# Patient Record
Sex: Female | Born: 1950 | Race: Black or African American | Hispanic: No | Marital: Married | Smoking: Never smoker
Health system: Southern US, Community
[De-identification: ages and names within clinical notes are randomized; demographics above are authoritative.]

## PROBLEM LIST (undated history)

## (undated) DIAGNOSIS — I10 Essential (primary) hypertension: Secondary | ICD-10-CM

## (undated) DIAGNOSIS — M1990 Unspecified osteoarthritis, unspecified site: Secondary | ICD-10-CM

## (undated) DIAGNOSIS — R011 Cardiac murmur, unspecified: Secondary | ICD-10-CM

## (undated) HISTORY — DX: Cardiac murmur, unspecified: R01.1

## (undated) HISTORY — DX: Essential (primary) hypertension: I10

## (undated) HISTORY — DX: Unspecified osteoarthritis, unspecified site: M19.90

---

## 1996-07-04 HISTORY — PX: ANKLE SURGERY: SHX546

## 2014-07-22 ENCOUNTER — Ambulatory Visit: Payer: Self-pay | Admitting: Internal Medicine

## 2014-09-03 DIAGNOSIS — E559 Vitamin D deficiency, unspecified: Secondary | ICD-10-CM | POA: Insufficient documentation

## 2014-09-16 ENCOUNTER — Ambulatory Visit: Payer: Self-pay | Admitting: Internal Medicine

## 2015-12-23 ENCOUNTER — Encounter: Payer: Self-pay | Admitting: Urology

## 2015-12-23 ENCOUNTER — Ambulatory Visit (INDEPENDENT_AMBULATORY_CARE_PROVIDER_SITE_OTHER): Payer: Federal, State, Local not specified - PPO | Admitting: Urology

## 2015-12-23 VITALS — BP 145/83 | HR 60 | Ht 65.0 in | Wt 190.2 lb

## 2015-12-23 DIAGNOSIS — I10 Essential (primary) hypertension: Secondary | ICD-10-CM | POA: Insufficient documentation

## 2015-12-23 DIAGNOSIS — R3129 Other microscopic hematuria: Secondary | ICD-10-CM

## 2015-12-23 LAB — MICROSCOPIC EXAMINATION: BACTERIA UA: NONE SEEN

## 2015-12-23 LAB — URINALYSIS, COMPLETE
Bilirubin, UA: NEGATIVE
GLUCOSE, UA: NEGATIVE
Ketones, UA: NEGATIVE
Leukocytes, UA: NEGATIVE
Nitrite, UA: NEGATIVE
PROTEIN UA: NEGATIVE
Specific Gravity, UA: 1.02 (ref 1.005–1.030)
UUROB: 0.2 mg/dL (ref 0.2–1.0)
pH, UA: 5.5 (ref 5.0–7.5)

## 2015-12-23 NOTE — Progress Notes (Signed)
12/23/2015 10:09 AM   Latoya Hicks 1950-07-13 [id number]  Referring provider: Tracie Harrier, MD [address] [facility] [place], [place] [address]  Chief Complaint  Patient presents with  . New Patient (Initial Visit)    microscopic hematuria    HPI: 65 year old female referred for further evaluation of microscopic hematuria.  She's had several urinalyses over the past year which show evidence of microscopic blood. On 11/11/2015, she had 10-50 red blood cells and otherwise unremarkable urine. This was also present on 1116 with 4-10 red blood cells per high-powered field without evidence of infection.  No history of groos hematuria, UTIs or kidney stones.  No flank pain.  She does endorse occasional lower back pain.    She has no urinary complains including urinary frequency, urgency, or incontinence.  She occasionally feels that she does not empty complete and returns to the bathroom ~30 min for a very small amount.     UA today shows no evidence of endoscopic hematuria although there is 2+ blood on the dip. There is no evidence of infection and urine is otherwise unremarkable.   Never smoker.    She is fairly healthy.  She does not take any anticoagulation or ASA.     PMH: Past Medical History  Diagnosis Date  . Arthritis   . Heart murmur   . Hypertension     Surgical History: Past Surgical History  Procedure Laterality Date  . Ankle surgery Left 1998    Home Medications:    Medication List       This list is accurate as of: 12/23/15 10:09 AM.  Always use your most recent med list.               lisinopril-hydrochlorothiazide 10-12.5 MG tablet  Commonly known as:  PRINZIDE,ZESTORETIC  Take 1 tablet by mouth daily.     MULTI-VITAMINS Tabs  Take by mouth.        Allergies: No Known Allergies  Family History: Family History  Problem Relation Age of Onset  . Prostate cancer Father   . Prostate cancer Brother   . Kidney cancer  Neg Hx     Social History:  reports that she has never smoked. She does not have any smokeless tobacco history on file. She reports that she does not drink alcohol or use illicit drugs.  ROS: UROLOGY Frequent Urination?: No Hard to postpone urination?: No Burning/pain with urination?: No Get up at night to urinate?: Yes Leakage of urine?: No Urine stream starts and stops?: No Trouble starting stream?: No Do you have to strain to urinate?: No Blood in urine?: Yes Urinary tract infection?: No Sexually transmitted disease?: No Injury to kidneys or bladder?: No Painful intercourse?: No Weak stream?: No Currently pregnant?: No Vaginal bleeding?: No Last menstrual period?: n  Gastrointestinal Nausea?: No Vomiting?: No Indigestion/heartburn?: Yes Diarrhea?: No Constipation?: No  Constitutional Fever: No Night sweats?: No Weight loss?: No Fatigue?: No  Skin Skin rash/lesions?: No Itching?: No  Eyes Blurred vision?: No Double vision?: No  Ears/Nose/Throat Sore throat?: No Sinus problems?: Yes  Hematologic/Lymphatic Swollen glands?: No Easy bruising?: No  Cardiovascular Leg swelling?: No Chest pain?: Yes  Respiratory Cough?: No Shortness of breath?: No  Endocrine Excessive thirst?: No  Musculoskeletal Back pain?: Yes Joint pain?: Yes  Neurological Headaches?: Yes Dizziness?: No  Psychologic Depression?: No Anxiety?: No  Physical Exam: BP 145/83 mmHg  Pulse 60  Ht '5\' 5"'$  (1.651 m)  Wt 190 lb 3.2 oz (86.274 kg)  BMI 31.65 kg/m2  Constitutional:  Alert and oriented, No acute distress. HEENT: [employer] AT, moist mucus membranes.  Trachea midline, no masses. Cardiovascular: No clubbing, cyanosis, or edema. Respiratory: Normal respiratory effort, no increased work of breathing. GI: Abdomen is soft, nontender, nondistended, no abdominal masses GU: No CVA tenderness.  Skin: No rashes, bruises or suspicious lesions. Lymph: No cervical or inguinal  adenopathy. Neurologic: Grossly intact, no focal deficits, moving all 4 extremities. Psychiatric: Normal mood and affect.  Laboratory Data: Comprehensive Metabolic Panel (CMP) (07/12/3233 7:29 AM) Comprehensive Metabolic Panel (CMP) (57/32/2025 7:29 AM)  Component Value Ref Range  Glucose 164 (H) 70 - 110 mg/dL  Sodium 142 136 - 145 mmol/L  Potassium 3.8 3.6 - 5.1 mmol/L  Chloride 105 97 - 109 mmol/L  Carbon Dioxide (CO2) 26.2 22.0 - 32.0 mmol/L  Urea Nitrogen (BUN) 13 7 - 25 mg/dL  Creatinine 0.8 0.6 - 1.1 mg/dL  Glomerular Filtration Rate (eGFR), MDRD Estimate 88 >60 mL/min/1.73sq m   CBC w/auto Differential (5 Part) (11/11/2015 7:29 AM) CBC w/auto Differential (5 Part) (11/11/2015 7:29 AM)  Component Value Ref Range  WBC (White Blood Cell Count) 4.7 4.1 - 10.2 10^3/uL  RBC (Red Blood Cell Count) 3.86 (L) 4.04 - 5.48 10^6/uL  Hemoglobin 11.9 (L) 12.0 - 15.0 gm/dL  Hematocrit 36.3 35.0 - 47.0 %  MCV (Mean Corpuscular Volume) 94.0 80.0 - 100.0 fl  MCH (Mean Corpuscular Hemoglobin) 30.8 27.0 - 31.2 pg  MCHC (Mean Corpuscular Hemoglobin Concentration) 32.8 32.0 - 36.0 gm/dL  Platelet Count 274 150 - 450 10^3/uL      Urinalysis Urinalysis w/Microscopic (11/11/2015 7:29 AM) Urinalysis w/Microscopic (11/11/2015 7:29 AM)  Component Value Ref Range  Color Yellow Yellow, Straw  Clarity Clear Clear  Specific Gravity 1.025 1.000 - 1.030  pH, Urine 5.5 5.0 - 8.0  Protein, Urinalysis Negative Negative, Trace mg/dL  Glucose, Urinalysis Negative Negative mg/dL  Ketones, Urinalysis Negative Negative mg/dL  Blood, Urinalysis Large (A) Negative  Nitrite, Urinalysis Negative Negative  Leukocyte Esterase, Urinalysis Negative Negative  White Blood Cells, Urinalysis 0-3 None Seen, 0-3 /hpf  Red Blood Cells, Urinalysis 10-50 (A) None Seen, 0-3 /hpf  Bacteria, Urinalysis None Seen None Seen /hpf  Squamous Epithelial Cells, Urinalysis Few Rare, Few, None Seen /hpf   UA today complete  negative  Pertinent Imaging: Previous CT w/ contrast 09/2014 --> report no pathology (uanble to view images), no delayed film  Assessment & Plan:    1. Microscopic hematura We discussed the differential diagnosis for microscopic hematuria including nephrolithiasis, renal or upper tract tumors, bladder stones, UTIs, or bladder tumors as well as undetermined etiologies. Per AUA guidelines, I did recommend complete microscopic hematuria evaluation including CTU, possible urine cytology, and office cystoscopy. - Urinalysis, Complete   Return in about 4 weeks (around 01/20/2016) for f/u CT urogram, cystoscopy.  Hollice Espy, MD  [facility] [address] [place], [place] [address] [phone]

## 2016-01-29 ENCOUNTER — Other Ambulatory Visit: Payer: Federal, State, Local not specified - PPO | Admitting: Urology

## 2016-02-05 ENCOUNTER — Other Ambulatory Visit: Payer: Federal, State, Local not specified - PPO | Admitting: Urology

## 2016-02-17 ENCOUNTER — Ambulatory Visit: Payer: Medicare Other

## 2016-02-17 ENCOUNTER — Ambulatory Visit
Admission: RE | Admit: 2016-02-17 | Discharge: 2016-02-17 | Disposition: A | Discharge status: Home / Self Care | Payer: Medicare Other | Source: Ambulatory Visit | Attending: Urology | Admitting: Urology

## 2016-02-17 DIAGNOSIS — R3129 Other microscopic hematuria: Secondary | ICD-10-CM | POA: Insufficient documentation

## 2016-02-17 LAB — POCT I-STAT CREATININE: Creatinine, Ser: 0.9 mg/dL (ref 0.44–1.00)

## 2016-02-17 MED ORDER — IOPAMIDOL (ISOVUE-300) INJECTION 61%
125.0000 mL | Freq: Once | INTRAVENOUS | Status: AC | PRN
  Administered 2016-02-17: 125 mL via INTRAVENOUS

## 2016-02-24 ENCOUNTER — Ambulatory Visit (INDEPENDENT_AMBULATORY_CARE_PROVIDER_SITE_OTHER): Payer: Medicare Other | Admitting: Urology

## 2016-02-24 VITALS — BP 131/85 | HR 69 | Ht 65.0 in | Wt 185.0 lb

## 2016-02-24 DIAGNOSIS — R3129 Other microscopic hematuria: Secondary | ICD-10-CM

## 2016-02-24 DIAGNOSIS — N362 Urethral caruncle: Secondary | ICD-10-CM

## 2016-02-24 LAB — MICROSCOPIC EXAMINATION: Bacteria, UA: NONE SEEN

## 2016-02-24 LAB — URINALYSIS, COMPLETE
Bilirubin, UA: NEGATIVE
GLUCOSE, UA: NEGATIVE
KETONES UA: NEGATIVE
Leukocytes, UA: NEGATIVE
Nitrite, UA: NEGATIVE
PH UA: 5.5 (ref 5.0–7.5)
PROTEIN UA: NEGATIVE
Specific Gravity, UA: 1.025 (ref 1.005–1.030)
UUROB: 0.2 mg/dL (ref 0.2–1.0)

## 2016-02-24 MED ORDER — LIDOCAINE HCL 2 % EX GEL
1.0000 "application " | Freq: Once | CUTANEOUS | Status: AC
  Administered 2016-02-24: 1 via URETHRAL

## 2016-02-24 MED ORDER — CIPROFLOXACIN HCL 500 MG PO TABS
500.0000 mg | ORAL_TABLET | Freq: Once | ORAL | Status: AC
  Administered 2016-02-24: 500 mg via ORAL

## 2016-02-24 NOTE — Progress Notes (Addendum)
02/24/2016 4:16 PM   Gearldine BienenstockJean Woon 27-Apr-1951 [id number]  Referring provider: Barbette ReichmannVishwanath Hande, MD [address] [facility] [place], [place] [address]  Chief Complaint  Patient presents with  . Cysto    HPI: 65 year old female with microscopic hematuria who presents today for office cystoscopy.  She recently underwent CT urogram which showed no evidence of GU pathology. Incidentally, right ureter was incompletely opacified on this study.  She has no smoking history.  History of gross hematuria, flank pain, or kidney stones.   PMH: Past Medical History:  Diagnosis Date  . Arthritis   . Heart murmur   . Hypertension     Surgical History: Past Surgical History:  Procedure Laterality Date  . ANKLE SURGERY Left 1998    Home Medications:    Medication List       Accurate as of 02/24/16 11:59 PM. Always use your most recent med list.          lisinopril-hydrochlorothiazide 10-12.5 MG tablet Commonly known as:  PRINZIDE,ZESTORETIC Take 1 tablet by mouth daily.   MULTI-VITAMINS Tabs Take by mouth.       Allergies: No Known Allergies  Family History: Family History  Problem Relation Age of Onset  . Prostate cancer Father   . Prostate cancer Brother   . Kidney cancer Neg Hx     Social History:  reports that she has never smoked. She does not have any smokeless tobacco history on file. She reports that she does not drink alcohol or use drugs.   Physical Exam: BP 131/85   Pulse 69   Ht 5\' 5"  (1.651 m)   Wt 185 lb (83.9 kg)   BMI 30.79 kg/m   Constitutional:  Alert and oriented, No acute distress. HEENT: [place] AT, moist mucus membranes.  Trachea midline, no masses. Cardiovascular: No clubbing, cyanosis, or edema. Respiratory: Normal respiratory effort, no increased work of breathing. GI: Abdomen is soft, nontender, nondistended, no abdominal masses GU: Urethral meatus with a small caruncle at 6 clock position, slightly erythematous in  appearance, scant bleeding with manipulation Skin: No rashes, bruises or suspicious lesions. Neurologic: Grossly intact, no focal deficits, moving all 4 extremities. Psychiatric: Normal mood and affect.  Laboratory Data: Lab Results  Component Value Date   CREATININE 0.90 02/17/2016     Urinalysis Results for orders placed or performed in visit on 02/24/16  Microscopic Examination  Result Value Ref Range   WBC, UA 0-5 0 - 5 /hpf   RBC, UA 11-30 (A) 0 - 2 /hpf   Epithelial Cells (non renal) 0-10 0 - 10 /hpf   Bacteria, UA None seen None seen/Few  Urinalysis, Complete  Result Value Ref Range   Specific Gravity, UA 1.025 1.005 - 1.030   pH, UA 5.5 5.0 - 7.5   Color, UA Yellow Yellow   Appearance Ur Clear Clear   Leukocytes, UA Negative Negative   Protein, UA Negative Negative/Trace   Glucose, UA Negative Negative   Ketones, UA Negative Negative   RBC, UA 3+ (A) Negative   Bilirubin, UA Negative Negative   Urobilinogen, Ur 0.2 0.2 - 1.0 mg/dL   Nitrite, UA Negative Negative   Microscopic Examination See below:     Pertinent Imaging: Study Result   CLINICAL DATA:  Microscopic hematuria, intermittent low back pain, incomplete voiding.  EXAM: CT ABDOMEN AND PELVIS WITHOUT AND WITH CONTRAST  TECHNIQUE: Multidetector CT imaging of the abdomen and pelvis was performed following the standard protocol before and following the bolus  administration of intravenous contrast.  CONTRAST:  125mL ISOVUE-300 IOPAMIDOL (ISOVUE-300) INJECTION 61%  COMPARISON:  09/16/2014.  FINDINGS: Lower chest: Lung bases show no acute findings. Heart size normal. No pericardial or pleural effusion.  Hepatobiliary: Liver and gallbladder are unremarkable. No biliary ductal dilatation.  Pancreas: Negative.  Spleen: Adrenal glands are unremarkable. Sub cm low-attenuation lesion in the right kidney is too small to characterize but statistically, a cyst is most likely. Lower portion of  the right ureter is poorly opacified, limiting evaluation. Otherwise, no filling defects in the intrarenal collecting systems or ureters. No urinary stones. Bladder is unremarkable.  Adrenals/Urinary Tract: Stomach, small bowel and appendix are unremarkable. Stool is seen in the majority of the colon.  Stomach/Bowel: Scattered atherosclerotic calcification of the arterial vasculature. No pathologically enlarged lymph nodes.  Vascular/Lymphatic: Uterus and ovaries are visualized. No adnexal mass.  Reproductive: No free fluid. Mesenteries and peritoneum are unremarkable.  Other: No free fluid.  Mesenteries and peritoneum are unremarkable.  Musculoskeletal: No worrisome lytic or sclerotic lesions.  IMPRESSION: Lower portion of the right ureter is poorly opacified, limiting evaluation. Otherwise, no findings to explain the patient's hematuria.   Electronically Signed   By: Leanna BattlesMelinda  Blietz M.D.   On: 02/17/2016 12:36   CT urogram personally reviewed.  Assessment & Plan:    1. Microscopic hematuria Status post-complete workup including CT urogram which shows incidental poorly opacified right ureter which is iatrogenic along with urethra caruncle.  We discussed that there is a very small risk of missing a distal ureteral tumor on the right given the incomplete opacification given her low risk nonsmoking.  Options including proceeding to the operating room for retrograde pyelogram were discussed and she is not interested pursuing this given her very low risk.   - Urinalysis, Complete - ciprofloxacin (CIPRO) tablet 500 mg; Take 1 tablet (500 mg total) by mouth once. - lidocaine (XYLOCAINE) 2 % jelly 1 application; Place 1 application into the urethra once.  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 I explained that treatment is not needed unless she becomes symptomatic. This may be the source of her microscopic hematuria.  Return if symptoms  worsen or fail to improve.  Vanna ScotlandAshley Kijana Estock, MD  [address] [facility] [address], [address] [place], [place] [address] [phone]  Addendum: Urine cytology negative.

## 2016-03-09 ENCOUNTER — Other Ambulatory Visit: Payer: Self-pay | Admitting: Urology

## 2016-10-27 ENCOUNTER — Other Ambulatory Visit: Payer: Self-pay | Admitting: Internal Medicine

## 2016-10-27 DIAGNOSIS — Z1231 Encounter for screening mammogram for malignant neoplasm of breast: Secondary | ICD-10-CM

## 2016-11-03 ENCOUNTER — Ambulatory Visit
Admission: RE | Admit: 2016-11-03 | Discharge: 2016-11-03 | Disposition: A | Discharge status: Home / Self Care | Payer: Medicare Other | Source: Ambulatory Visit | Attending: Internal Medicine | Admitting: Internal Medicine

## 2016-11-03 DIAGNOSIS — Z1231 Encounter for screening mammogram for malignant neoplasm of breast: Secondary | ICD-10-CM | POA: Diagnosis present

## 2017-09-22 ENCOUNTER — Other Ambulatory Visit: Payer: Self-pay | Admitting: Internal Medicine

## 2017-09-22 DIAGNOSIS — M25512 Pain in left shoulder: Secondary | ICD-10-CM

## 2017-09-22 DIAGNOSIS — E559 Vitamin D deficiency, unspecified: Secondary | ICD-10-CM

## 2017-09-22 DIAGNOSIS — I10 Essential (primary) hypertension: Secondary | ICD-10-CM

## 2017-09-29 ENCOUNTER — Ambulatory Visit
Admission: RE | Admit: 2017-09-29 | Discharge: 2017-09-29 | Disposition: A | Discharge status: Home / Self Care | Payer: Medicare Other | Source: Ambulatory Visit | Attending: Internal Medicine | Admitting: Internal Medicine

## 2017-09-29 DIAGNOSIS — M7552 Bursitis of left shoulder: Secondary | ICD-10-CM | POA: Insufficient documentation

## 2017-09-29 DIAGNOSIS — M7582 Other shoulder lesions, left shoulder: Secondary | ICD-10-CM | POA: Insufficient documentation

## 2017-09-29 DIAGNOSIS — M75102 Unspecified rotator cuff tear or rupture of left shoulder, not specified as traumatic: Secondary | ICD-10-CM | POA: Insufficient documentation

## 2017-09-29 DIAGNOSIS — I10 Essential (primary) hypertension: Secondary | ICD-10-CM | POA: Insufficient documentation

## 2017-09-29 DIAGNOSIS — M25512 Pain in left shoulder: Secondary | ICD-10-CM

## 2017-09-29 DIAGNOSIS — E559 Vitamin D deficiency, unspecified: Secondary | ICD-10-CM | POA: Insufficient documentation

## 2018-08-06 ENCOUNTER — Other Ambulatory Visit: Payer: Self-pay | Admitting: Internal Medicine

## 2018-08-06 DIAGNOSIS — Z1231 Encounter for screening mammogram for malignant neoplasm of breast: Secondary | ICD-10-CM

## 2018-08-28 ENCOUNTER — Ambulatory Visit
Admission: RE | Admit: 2018-08-28 | Discharge: 2018-08-28 | Disposition: A | Discharge status: Home / Self Care | Payer: Medicare Other | Source: Ambulatory Visit | Attending: Internal Medicine | Admitting: Internal Medicine

## 2018-08-28 DIAGNOSIS — Z1231 Encounter for screening mammogram for malignant neoplasm of breast: Secondary | ICD-10-CM | POA: Insufficient documentation

## 2019-07-06 ENCOUNTER — Encounter: Payer: Self-pay | Admitting: Emergency Medicine

## 2019-07-06 ENCOUNTER — Emergency Department
Admission: EM | Admit: 2019-07-06 | Discharge: 2019-07-06 | Disposition: A | Discharge status: Home / Self Care | Payer: Medicare Other | Attending: Emergency Medicine | Admitting: Emergency Medicine

## 2019-07-06 ENCOUNTER — Emergency Department: Payer: Medicare Other

## 2019-07-06 ENCOUNTER — Other Ambulatory Visit: Payer: Self-pay

## 2019-07-06 DIAGNOSIS — R0789 Other chest pain: Secondary | ICD-10-CM | POA: Insufficient documentation

## 2019-07-06 DIAGNOSIS — K209 Esophagitis, unspecified without bleeding: Secondary | ICD-10-CM | POA: Diagnosis not present

## 2019-07-06 DIAGNOSIS — I10 Essential (primary) hypertension: Secondary | ICD-10-CM | POA: Insufficient documentation

## 2019-07-06 DIAGNOSIS — Z79899 Other long term (current) drug therapy: Secondary | ICD-10-CM | POA: Diagnosis not present

## 2019-07-06 DIAGNOSIS — R070 Pain in throat: Secondary | ICD-10-CM

## 2019-07-06 DIAGNOSIS — R079 Chest pain, unspecified: Secondary | ICD-10-CM

## 2019-07-06 LAB — CBC
HCT: 37.9 % (ref 36.0–46.0)
Hemoglobin: 12.9 g/dL (ref 12.0–15.0)
MCH: 30.9 pg (ref 26.0–34.0)
MCHC: 34 g/dL (ref 30.0–36.0)
MCV: 90.7 fL (ref 80.0–100.0)
Platelets: 263 10*3/uL (ref 150–400)
RBC: 4.18 MIL/uL (ref 3.87–5.11)
RDW: 11.7 % (ref 11.5–15.5)
WBC: 5 10*3/uL (ref 4.0–10.5)
nRBC: 0 % (ref 0.0–0.2)

## 2019-07-06 LAB — BASIC METABOLIC PANEL
Anion gap: 12 (ref 5–15)
BUN: 16 mg/dL (ref 8–23)
CO2: 27 mmol/L (ref 22–32)
Calcium: 9.6 mg/dL (ref 8.9–10.3)
Chloride: 101 mmol/L (ref 98–111)
Creatinine, Ser: 0.85 mg/dL (ref 0.44–1.00)
GFR calc Af Amer: >60 mL/min (ref >60)
GFR calc non Af Amer: >60 mL/min (ref >60)
Glucose, Bld: 133 mg/dL — ABNORMAL HIGH (ref 70–99)
Potassium: 3.9 mmol/L (ref 3.5–5.1)
Sodium: 140 mmol/L (ref 135–145)

## 2019-07-06 LAB — TROPONIN I (HIGH SENSITIVITY)
Troponin I (High Sensitivity): 4 ng/L (ref <18)
Troponin I (High Sensitivity): 5 ng/L (ref <18)

## 2019-07-06 LAB — GROUP A STREP BY PCR: Group A Strep by PCR: NOT DETECTED

## 2019-07-06 MED ORDER — ALUM & MAG HYDROXIDE-SIMETH 200-200-20 MG/5ML PO SUSP
30.0000 mL | Freq: Once | ORAL | Status: AC
  Administered 2019-07-06: 17:00:00 30 mL via ORAL
  Filled 2019-07-06: qty 30

## 2019-07-06 MED ORDER — IOHEXOL 350 MG/ML SOLN
100.0000 mL | Freq: Once | INTRAVENOUS | Status: AC | PRN
  Administered 2019-07-06: 16:00:00 100 mL via INTRAVENOUS
  Filled 2019-07-06: qty 100

## 2019-07-06 MED ORDER — PANTOPRAZOLE SODIUM 40 MG PO TBEC: 40.0000 mg | DELAYED_RELEASE_TABLET | Freq: Every day | ORAL | 1 refills | Status: AC

## 2019-07-06 MED ORDER — SODIUM CHLORIDE 0.9% FLUSH: 3.0000 mL | Freq: Once | INTRAVENOUS | Status: DC

## 2019-07-06 MED ORDER — LIDOCAINE VISCOUS HCL 2 % MT SOLN
15.0000 mL | Freq: Once | OROMUCOSAL | Status: AC
Start: 2019-07-06 — End: 2019-07-06
  Administered 2019-07-06: 17:00:00 15 mL via ORAL
  Filled 2019-07-06: qty 15

## 2019-07-06 NOTE — ED Triage Notes (Signed)
Pt to from [facility] in for left sided shoulder, neck, and chest pain that started on Thursday. Pt states that the pain has not gotten any better. She is now having pain when she swallows. Pt denies any other symptoms. Pt denies cardiac history. Pt is in NAD.

## 2019-07-06 NOTE — Discharge Instructions (Signed)
Follow-up with your regular doctor as needed.  Continue your regular medications.  Take the Protonix for the esophagitis.  Follow-up with ENT and GI.  If worsening return emergency department.

## 2019-07-06 NOTE — ED Provider Notes (Signed)
Patient was seen and examined by me with odynophagia and neck/chest pain. Clinically looks well with negative work up thus far.    Emily Filbert, MD 07/06/19 1540

## 2019-07-06 NOTE — ED Provider Notes (Signed)
[facility] Emergency Department Provider Note  ____________________________________________   First MD Initiated Contact with Patient 07/06/19 1436     (approximate)  I have reviewed the triage vital signs and the nursing notes.   HISTORY  Chief Complaint Chest Pain    HPI Latoya Hicks is a 69 y.o. female presents emergency department complaining of left-sided neck pain, sore throat, and chest pain.  States it is been difficult to swallow.  Area is hurting and burning.  She denies fever, chills, shortness of breath, or Covid exposure.    Past Medical History:  Diagnosis Date  . Arthritis   . Heart murmur   . Hypertension     Patient Active Problem List   Diagnosis Date Noted  . BP (high blood pressure) 12/23/2015  . Vitamin D deficiency 09/03/2014    Past Surgical History:  Procedure Laterality Date  . ANKLE SURGERY Left 1998    Prior to Admission medications   Medication Sig Start Date End Date Taking? Authorizing Provider  lisinopril-hydrochlorothiazide (PRINZIDE,ZESTORETIC) 10-12.5 MG tablet Take 1 tablet by mouth daily. 09/29/15   [provider]  Multiple Vitamin (MULTI-VITAMINS) TABS Take by mouth.    [provider]  pantoprazole (PROTONIX) 40 MG tablet Take 1 tablet (40 mg total) by mouth daily. 07/06/19 07/05/20  Faythe Ghee, PA-C    Allergies Patient has no known allergies.  Family History  Problem Relation Age of Onset  . Prostate cancer Father   . Prostate cancer Brother   . Kidney cancer Neg Hx   . Breast cancer Neg Hx     Social History Social History   Tobacco Use  . Smoking status: Never Smoker  . Smokeless tobacco: Never Used  Substance Use Topics  . Alcohol use: No    Alcohol/week: 0.0 standard drinks  . Drug use: No    Review of Systems  Constitutional: No fever/chills Eyes: No visual changes. ENT: Positive sore throat. Respiratory: Denies cough Cardiovascular: Positive chest  pain Gastrointestinal: Denies abdominal pain Genitourinary: Negative for dysuria. Musculoskeletal: Negative for back pain. Skin: Negative for rash. Psychiatric: Denies mood changes    ____________________________________________   PHYSICAL EXAM:  VITAL SIGNS: ED Triage Vitals  Enc Vitals Group     BP 07/06/19 1148 (!) 150/92     Pulse Rate 07/06/19 1148 72     Resp 07/06/19 1148 16     Temp 07/06/19 1148 99 F (37.2 C)     Temp Source 07/06/19 1148 Oral     SpO2 07/06/19 1148 97 %     Weight 07/06/19 1149 185 lb (83.9 kg)     Height 07/06/19 1149 5\' 5"  (1.651 m)     Head Circumference --      Peak Flow --      Pain Score 07/06/19 1149 5     Pain Loc --      Pain Edu? --      Excl. in GC? --     Constitutional: Alert and oriented. Well appearing and in no acute distress. Eyes: Conjunctivae are normal.  Head: Atraumatic. Nose: No congestion/rhinnorhea. Mouth/Throat: Mucous membranes are moist.  Throat appears to be irritated Neck:  supple no lymphadenopathy noted, left side of the neck is tender to palpation Cardiovascular: Normal rate, regular rhythm. Heart sounds are normal Respiratory: Normal respiratory effort.  No retractions, lungs c t a  Abd: soft nontender bs normal all 4 quad GU: deferred Musculoskeletal: FROM all extremities, warm and well perfused Neurologic:  Normal speech and language.  Skin:  Skin is warm, dry and intact. No rash noted. Psychiatric: Mood and affect are normal. Speech and behavior are normal.  ____________________________________________   LABS (all labs ordered are listed, but only abnormal results are displayed)  Labs Reviewed  BASIC METABOLIC PANEL - Abnormal; Notable for the following components:      Result Value   Glucose, Bld 133 (*)    All other components within normal limits  GROUP A STREP BY PCR  CBC  TROPONIN I (HIGH SENSITIVITY)  TROPONIN I (HIGH SENSITIVITY)   ____________________________________________    ____________________________________________  RADIOLOGY  Chest x-ray is normal CT angios chest does not show dissection  ____________________________________________   PROCEDURES  Procedure(s) performed: GI cocktail   Procedures    ____________________________________________   INITIAL IMPRESSION / ASSESSMENT AND PLAN / ED COURSE  Pertinent labs & imaging results that were available during my care of the patient were reviewed by me and considered in my medical decision making (see chart for details).   Patient is a 69 year old female presents emergency department complaining of a sore throat and left-sided chest pain which started last night.  See HPI  Physical exam patient appears well.  Left side of the neck is tender to palpation, throat appears irritated, remainder the exam is unremarkable  DDx: Strep throat, esophagitis, lymphadenitis, aortic dissection  CBC is normal, troponin is normal, basic metabolic panel is normal, strep test is negative  Chest x-ray is normal CT angio of the chest abdomen pelvis did not show dissection  The patient was given a GI cocktail which included viscous lidocaine, patient states she had some relief with this medication.  Therefore I feel it would be safe to say patient is having some esophagitis type issues.  She is to follow-up with ENT and GI.  She was given a prescription for Protonix.  She is to follow-up with her regular doctor as needed.  Return emergency department worsening.  She states she understands will comply.  She was discharged in stable condition.   Latoya Hicks was evaluated in Emergency Department on 07/06/2019 for the symptoms described in the history of present illness. She was evaluated in the context of the global COVID-19 pandemic, which necessitated consideration that the patient might be at risk for infection with the SARS-CoV-2 virus that causes COVID-19. Institutional protocols and algorithms that pertain to the  evaluation of patients at risk for COVID-19 are in a state of rapid change based on information released by regulatory bodies including the CDC and federal and state organizations. These policies and algorithms were followed during the patient's care in the ED.   As part of my medical decision making, I reviewed the following data within the electronic MEDICAL RECORD NUMBER Nursing notes reviewed and incorporated, Labs reviewed see above, EKG interpreted NSR, Old chart reviewed, Radiograph reviewed chest x-ray normal, CT angio chest abdomen pelvis is negative, Evaluated by EM attending Dr. Mayford Knife, Notes from prior ED visits and [place] Controlled Substance Database  ____________________________________________   FINAL CLINICAL IMPRESSION(S) / ED DIAGNOSES  Final diagnoses:  Throat pain in adult  Nonspecific chest pain  Esophagitis      NEW MEDICATIONS STARTED DURING THIS VISIT:  New Prescriptions   PANTOPRAZOLE (PROTONIX) 40 MG TABLET    Take 1 tablet (40 mg total) by mouth daily.     Note:  This document was prepared using Dragon voice recognition software and may include unintentional dictation errors.    Faythe Ghee,  PA-C 07/06/19 Cleophas Dunker, MD 07/07/19 [phone]

## 2019-10-03 ENCOUNTER — Other Ambulatory Visit: Payer: Self-pay | Admitting: Internal Medicine

## 2019-10-03 DIAGNOSIS — Z1231 Encounter for screening mammogram for malignant neoplasm of breast: Secondary | ICD-10-CM

## 2019-10-22 ENCOUNTER — Ambulatory Visit (INDEPENDENT_AMBULATORY_CARE_PROVIDER_SITE_OTHER): Payer: Medicare Other

## 2019-10-22 ENCOUNTER — Ambulatory Visit
Admission: RE | Admit: 2019-10-22 | Discharge: 2019-10-22 | Disposition: A | Discharge status: Home / Self Care | Payer: Medicare Other | Source: Ambulatory Visit | Attending: Internal Medicine | Admitting: Internal Medicine

## 2019-10-22 ENCOUNTER — Other Ambulatory Visit: Payer: Self-pay

## 2019-10-22 ENCOUNTER — Other Ambulatory Visit: Payer: Self-pay | Admitting: Podiatry

## 2019-10-22 ENCOUNTER — Encounter: Payer: Self-pay | Admitting: Podiatry

## 2019-10-22 ENCOUNTER — Ambulatory Visit (INDEPENDENT_AMBULATORY_CARE_PROVIDER_SITE_OTHER): Payer: Medicare Other | Admitting: Podiatry

## 2019-10-22 DIAGNOSIS — M79671 Pain in right foot: Secondary | ICD-10-CM

## 2019-10-22 DIAGNOSIS — M722 Plantar fascial fibromatosis: Secondary | ICD-10-CM | POA: Diagnosis not present

## 2019-10-22 DIAGNOSIS — M79672 Pain in left foot: Secondary | ICD-10-CM

## 2019-10-22 DIAGNOSIS — Z1231 Encounter for screening mammogram for malignant neoplasm of breast: Secondary | ICD-10-CM | POA: Insufficient documentation

## 2019-10-22 DIAGNOSIS — M7662 Achilles tendinitis, left leg: Secondary | ICD-10-CM

## 2019-10-22 NOTE — Progress Notes (Signed)
Subjective:  Patient ID: Latoya Hicks, female    DOB: [birth year],  MRN: [id number]  Chief Complaint  Patient presents with  . Foot Pain    pt is here for bottom/ back of heel pain, both feet, going on for about a couple of months, pt states it is painful to walk on, pt also has a possible bunion of the right foot she wants looked at as well.    69 y.o. female presents with the above complaint.  Patient presents with right heel pain and left posterior heel pain.  Patient states that they have been going on on and off for the past couple of months.  She states is painful to walk on pain and worse especially on the left side.  She states that she has not tried anything at home.  She has not seen anyone else for this.  She wears new balance supportive shoes.  She denies any other acute complaints.  Her pain is dull achy with sometimes sharp shooting.  Pain scale 7 out of 10.   Review of Systems: Negative except as noted in the HPI. Denies N/V/F/Ch.  Past Medical History:  Diagnosis Date  . Arthritis   . Heart murmur   . Hypertension     Current Outpatient Medications:  .  alendronate (FOSAMAX) 70 MG tablet, Take 70 mg by mouth once a week., Disp: , Rfl:  .  lisinopril-hydrochlorothiazide (PRINZIDE,ZESTORETIC) 10-12.5 MG tablet, Take 1 tablet by mouth daily., Disp: , Rfl: 1 .  Multiple Vitamin (MULTI-VITAMINS) TABS, Take by mouth., Disp: , Rfl:  .  pantoprazole (PROTONIX) 40 MG tablet, Take 1 tablet (40 mg total) by mouth daily., Disp: 30 tablet, Rfl: 1  Social History   Tobacco Use  Smoking Status Never Smoker  Smokeless Tobacco Never Used    No Known Allergies Objective:  There were no vitals filed for this visit. There is no height or weight on file to calculate BMI. Constitutional Well developed. Well nourished.  Vascular Dorsalis pedis pulses palpable bilaterally. Posterior tibial pulses palpable bilaterally. Capillary refill normal to all digits.  No cyanosis or clubbing  noted. Pedal hair growth normal.  Neurologic Normal speech. Oriented to person, place, and time. Epicritic sensation to light touch grossly present bilaterally.  Dermatologic Nails well groomed and normal in appearance. No open wounds. No skin lesions.  Orthopedic: Normal joint ROM without pain or crepitus bilaterally. No visible deformities. Tender to palpation at the calcaneal tuber right. No pain with calcaneal squeeze right. Ankle ROM diminished range of motion bilaterally. Silfverskiold Test: positive bilaterally.  Pain on the palpation of the Achilles tendon insertion.  No pain along the course of the tendon.  Pain with dorsiflexion of the ankle joint pain improved with plantarflexion of the ankle joint.  These findings were consistent with both active and passive range of motion.  No pain at the peroneal tendon, ATFL, posterior tibial tendon   Radiographs: Taken and reviewed. No acute fractures or dislocations. No evidence of stress fracture.  Plantar heel spur present. Posterior heel spur present.   Assessment:   1. Foot pain, bilateral    Plan:  Patient was evaluated and treated and all questions answered.  Plantar Fasciitis, Right  - XR reviewed as above.  - Educated on icing and stretching. Instructions given.  - Injection delivered to the plantar fascia as below. - DME: Plantar Fascial Brace - Pharmacologic management: None. Educated on risks/benefits and proper taking of medication.  Left Achilles tendinitis -I explained the  patient the etiology of Achilles tendinitis and various treatment options were extensively discussed.  Given that patient has mild to moderate pain to the left Achilles tendon insertion. -Tri-Lock ankle brace were dispensed to give stability to left ankle and therefore allow reduction in pain.  If there is no improvement will consider placing her in a cam boot immobilization to the left side.  Procedure: Injection Tendon/Ligament Location: Right  plantar fascia at the glabrous junction; medial approach. Skin Prep: alcohol Injectate: 0.5 cc 0.5% marcaine plain, 0.5 cc of 1% Lidocaine, 0.5 cc kenalog 10. Disposition: Patient tolerated procedure well. Injection site dressed with a band-aid.  No follow-ups on file.

## 2019-11-28 ENCOUNTER — Other Ambulatory Visit: Payer: Self-pay

## 2019-11-28 ENCOUNTER — Ambulatory Visit (INDEPENDENT_AMBULATORY_CARE_PROVIDER_SITE_OTHER): Payer: Medicare Other | Admitting: Podiatry

## 2019-11-28 DIAGNOSIS — M7730 Calcaneal spur, unspecified foot: Secondary | ICD-10-CM | POA: Diagnosis not present

## 2019-11-28 DIAGNOSIS — M7662 Achilles tendinitis, left leg: Secondary | ICD-10-CM

## 2019-11-28 DIAGNOSIS — M79671 Pain in right foot: Secondary | ICD-10-CM

## 2019-11-28 DIAGNOSIS — M79672 Pain in left foot: Secondary | ICD-10-CM | POA: Diagnosis not present

## 2019-11-28 DIAGNOSIS — M722 Plantar fascial fibromatosis: Secondary | ICD-10-CM | POA: Diagnosis not present

## 2019-12-04 ENCOUNTER — Encounter: Payer: Self-pay | Admitting: Podiatry

## 2019-12-04 NOTE — Progress Notes (Signed)
Subjective:  Patient ID: Latoya Hicks, female    DOB: 27-Dec-1950,  MRN: [id number]  Chief Complaint  Patient presents with  . Foot Pain    pt is here for a f/u of foot pain bil, pt states that both of her feet are still hurting, pt states that the injection she recieved last time has worn off a little bit, pt also puts pain scale right foot a 6 out of 10, and the left foot pain scale as a 2 out of 72    69 y.o. female presents with the above complaint.  Patient is following up with bilateral plantar fasciitis follow-up.  Patient states he is doing well on the left side he still has some pain on the right side.  He states the injection did help a lot.  He has been wearing his plantar fascial braces which has helped tremendously.  He has been doing his exercises well.  His pain scale 6 out of 10 on the right and 2 out of 10 on the left.  He denies any other acute complaints.  He would like to discuss further treatment options if there is anything else that could be done.   Review of Systems: Negative except as noted in the HPI. Denies N/V/F/Ch.  Past Medical History:  Diagnosis Date  . Arthritis   . Heart murmur   . Hypertension     Current Outpatient Medications:  .  alendronate (FOSAMAX) 70 MG tablet, Take 70 mg by mouth once a week., Disp: , Rfl:  .  lisinopril-hydrochlorothiazide (PRINZIDE,ZESTORETIC) 10-12.5 MG tablet, Take 1 tablet by mouth daily., Disp: , Rfl: 1 .  Multiple Vitamin (MULTI-VITAMINS) TABS, Take by mouth., Disp: , Rfl:  .  pantoprazole (PROTONIX) 40 MG tablet, Take 1 tablet (40 mg total) by mouth daily., Disp: 30 tablet, Rfl: 1  Social History   Tobacco Use  Smoking Status Never Smoker  Smokeless Tobacco Never Used    No Known Allergies Objective:  There were no vitals filed for this visit. There is no height or weight on file to calculate BMI. Constitutional Well developed. Well nourished.  Vascular Dorsalis pedis pulses palpable bilaterally. Posterior  tibial pulses palpable bilaterally. Capillary refill normal to all digits.  No cyanosis or clubbing noted. Pedal hair growth normal.  Neurologic Normal speech. Oriented to person, place, and time. Epicritic sensation to light touch grossly present bilaterally.  Dermatologic Nails well groomed and normal in appearance. No open wounds. No skin lesions.  Orthopedic: Normal joint ROM without pain or crepitus bilaterally. No visible deformities. Tender to palpation at the calcaneal tuber r bilateral No pain with calcaneal squeeze right. Ankle ROM diminished range of motion bilaterally. Silfverskiold Test: positive bilaterally.  No pain on the palpation of the Achilles tendon insertion.  No pain along the course of the tendon.  No pain with dorsiflexion of the ankle joint pain improved with plantarflexion of the ankle joint.  These findings were consistent with both active and passive range of motion.  No pain at the peroneal tendon, ATFL, posterior tibial tendon   Radiographs: Taken and reviewed. No acute fractures or dislocations. No evidence of stress fracture.  Plantar heel spur present. Posterior heel spur present.   Assessment:   1. Plantar fasciitis of right foot   2. Left Achilles tendinitis   3. Plantar fasciitis of left foot   4. Foot pain, bilateral   5. Heel spur, unspecified laterality    Plan:  Patient was evaluated and treated and all  questions answered.  Plantar Fasciitis, bilateral - XR reviewed as above.  - Educated on icing and stretching. Instructions given.  -Second injection delivered to the plantar fascia as below. - DME: Plantar Fascial Brace - Pharmacologic management: None. Educated on risks/benefits and proper taking of medication.  Left Achilles tendinitis -I resolved with a Tri-Lock ankle brace.  I discussed with the patient to continue stretching etc. continue wearing Tri-Lock ankle brace.  Procedure: Injection Tendon/Ligament Location: Bilateral  plantar fascia at the glabrous junction; medial approach. Skin Prep: alcohol Injectate: 0.5 cc 0.5% marcaine plain, 0.5 cc of 1% Lidocaine, 0.5 cc kenalog 10. Disposition: Patient tolerated procedure well. Injection site dressed with a band-aid.  No follow-ups on file.

## 2019-12-26 ENCOUNTER — Ambulatory Visit: Payer: Medicare Other | Admitting: Podiatry

## 2019-12-30 ENCOUNTER — Other Ambulatory Visit: Payer: Self-pay

## 2019-12-30 ENCOUNTER — Encounter: Payer: Self-pay | Admitting: Podiatry

## 2019-12-30 ENCOUNTER — Ambulatory Visit (INDEPENDENT_AMBULATORY_CARE_PROVIDER_SITE_OTHER): Payer: Medicare Other | Admitting: Podiatry

## 2019-12-30 DIAGNOSIS — M722 Plantar fascial fibromatosis: Secondary | ICD-10-CM

## 2019-12-30 NOTE — Progress Notes (Signed)
Subjective:  Patient ID: Latoya Hicks, female    DOB: 09-Sep-1950,  MRN: [id number]  Chief Complaint  Patient presents with  . Foot Pain    "It's doing better but it still has some pain in it."    69 y.o. female presents with the above complaint.  Patient presents with follow-up of bilateral plantar fasciitis.  Patient states she is doing well on both sides she still has a little bit of pain associated with it.  She states that the pain has gotten much better.  She still has not gone 100% better.  She would like to know if there is any other treatment options available.  She does not want steroid injection if she can avoid it.  She denies any other acute complaints.   Review of Systems: Negative except as noted in the HPI. Denies N/V/F/Ch.  Past Medical History:  Diagnosis Date  . Arthritis   . Heart murmur   . Hypertension     Current Outpatient Medications:  .  alendronate (FOSAMAX) 70 MG tablet, Take 70 mg by mouth once a week., Disp: , Rfl:  .  lisinopril-hydrochlorothiazide (PRINZIDE,ZESTORETIC) 10-12.5 MG tablet, Take 1 tablet by mouth daily., Disp: , Rfl: 1 .  Multiple Vitamin (MULTI-VITAMINS) TABS, Take by mouth., Disp: , Rfl:  .  pantoprazole (PROTONIX) 40 MG tablet, Take 1 tablet (40 mg total) by mouth daily., Disp: 30 tablet, Rfl: 1  Social History   Tobacco Use  Smoking Status Never Smoker  Smokeless Tobacco Never Used    No Known Allergies Objective:  There were no vitals filed for this visit. There is no height or weight on file to calculate BMI. Constitutional Well developed. Well nourished.  Vascular Dorsalis pedis pulses palpable bilaterally. Posterior tibial pulses palpable bilaterally. Capillary refill normal to all digits.  No cyanosis or clubbing noted. Pedal hair growth normal.  Neurologic Normal speech. Oriented to person, place, and time. Epicritic sensation to light touch grossly present bilaterally.  Dermatologic Nails well groomed and normal  in appearance. No open wounds. No skin lesions.  Orthopedic: Normal joint ROM without pain or crepitus bilaterally. No visible deformities. Tender to palpation at the calcaneal tuber r bilateral No pain with calcaneal squeeze right. Ankle ROM diminished range of motion bilaterally. Silfverskiold Test: positive bilaterally.  No pain on the palpation of the Achilles tendon insertion.  No pain along the course of the tendon.  No pain with dorsiflexion of the ankle joint pain improved with plantarflexion of the ankle joint.  These findings were consistent with both active and passive range of motion.  No pain at the peroneal tendon, ATFL, posterior tibial tendon   Radiographs: Taken and reviewed. No acute fractures or dislocations. No evidence of stress fracture.  Plantar heel spur present. Posterior heel spur present.   Assessment:   1. Plantar fasciitis of right foot   2. Plantar fasciitis of left foot    Plan:  Patient was evaluated and treated and all questions answered.  Plantar Fasciitis, bilateral - XR reviewed as above.  - Educated on icing and stretching. Instructions given.  -Hold off on steroid injection for now.  If there is no improvement we will consider doing steroid injection during next clinical visit. - DME: Bilateral night splints - Pharmacologic management: None. Educated on risks/benefits and proper taking of medication.  Left Achilles tendinitis -I resolved with a Tri-Lock ankle brace.  I discussed with the patient to continue stretching etc. continue wearing Tri-Lock ankle brace.   No  follow-ups on file.

## 2020-01-27 ENCOUNTER — Other Ambulatory Visit: Payer: Self-pay

## 2020-01-27 ENCOUNTER — Ambulatory Visit (INDEPENDENT_AMBULATORY_CARE_PROVIDER_SITE_OTHER): Payer: Medicare Other | Admitting: Podiatry

## 2020-01-27 DIAGNOSIS — M722 Plantar fascial fibromatosis: Secondary | ICD-10-CM

## 2020-01-27 DIAGNOSIS — M7730 Calcaneal spur, unspecified foot: Secondary | ICD-10-CM | POA: Diagnosis not present

## 2020-01-28 ENCOUNTER — Encounter: Payer: Self-pay | Admitting: Podiatry

## 2020-01-28 NOTE — Progress Notes (Signed)
Subjective:  Patient ID: Latoya Hicks, female    DOB: 1950/12/06,  MRN: [id number]  Chief Complaint  Patient presents with  . Foot Pain    pt is here for a 4 week f/u right heel pain.    69 y.o. female presents with the above complaint.  Patient presents with follow-up of bilateral plantar fasciitis.  Patient states she is doing really well on both sides.  She just has 90% residual pain.  She would just like to do another steroid injection to make it get to close to 100%.  She denies any other acute complaints.   Review of Systems: Negative except as noted in the HPI. Denies N/V/F/Ch.  Past Medical History:  Diagnosis Date  . Arthritis   . Heart murmur   . Hypertension     Current Outpatient Medications:  .  alendronate (FOSAMAX) 70 MG tablet, Take 70 mg by mouth once a week., Disp: , Rfl:  .  lisinopril-hydrochlorothiazide (PRINZIDE,ZESTORETIC) 10-12.5 MG tablet, Take 1 tablet by mouth daily., Disp: , Rfl: 1 .  Multiple Vitamin (MULTI-VITAMINS) TABS, Take by mouth., Disp: , Rfl:  .  pantoprazole (PROTONIX) 40 MG tablet, Take 1 tablet (40 mg total) by mouth daily., Disp: 30 tablet, Rfl: 1  Social History   Tobacco Use  Smoking Status Never Smoker  Smokeless Tobacco Never Used    No Known Allergies Objective:  There were no vitals filed for this visit. There is no height or weight on file to calculate BMI. Constitutional Well developed. Well nourished.  Vascular Dorsalis pedis pulses palpable bilaterally. Posterior tibial pulses palpable bilaterally. Capillary refill normal to all digits.  No cyanosis or clubbing noted. Pedal hair growth normal.  Neurologic Normal speech. Oriented to person, place, and time. Epicritic sensation to light touch grossly present bilaterally.  Dermatologic Nails well groomed and normal in appearance. No open wounds. No skin lesions.  Orthopedic: Normal joint ROM without pain or crepitus bilaterally. No visible deformities. Mild tender  to palpation at the calcaneal tuber r bilateral No pain with calcaneal squeeze right. Ankle ROM diminished range of motion bilaterally. Silfverskiold Test: positive bilaterally.  No pain on the palpation of the Achilles tendon insertion.  No pain along the course of the tendon.  No pain with dorsiflexion of the ankle joint pain improved with plantarflexion of the ankle joint.  These findings were consistent with both active and passive range of motion.  No pain at the peroneal tendon, ATFL, posterior tibial tendon   Radiographs: Taken and reviewed. No acute fractures or dislocations. No evidence of stress fracture.  Plantar heel spur present. Posterior heel spur present.   Assessment:   1. Plantar fasciitis of left foot   2. Plantar fasciitis of right foot   3. Heel spur, unspecified laterality    Plan:  Patient was evaluated and treated and all questions answered.  Plantar Fasciitis, bilateral - XR reviewed as above.  - Educated on icing and stretching. Instructions given.  -Second injection was delivered as detailed below. - DME: None continue wearing night splints and plantar fascial braces - Pharmacologic management: None. Educated on risks/benefits and proper taking of medication.  Left Achilles tendinitis -I resolved with a Tri-Lock ankle brace.  I discussed with the patient to continue stretching etc. continue wearing Tri-Lock ankle brace.   Procedure: Injection Tendon/Ligament Location: Bilateral plantar fascia at the glabrous junction; medial approach. Skin Prep: alcohol Injectate: 0.5 cc 0.5% marcaine plain, 0.5 cc of 1% Lidocaine, 0.5 cc kenalog 10. Disposition:  Patient tolerated procedure well. Injection site dressed with a band-aid.  No follow-ups on file.   No follow-ups on file.

## 2020-02-24 ENCOUNTER — Ambulatory Visit: Payer: Medicare Other | Admitting: Podiatry

## 2020-05-12 ENCOUNTER — Ambulatory Visit (INDEPENDENT_AMBULATORY_CARE_PROVIDER_SITE_OTHER): Payer: Medicare Other | Admitting: Podiatry

## 2020-05-12 ENCOUNTER — Encounter: Payer: Self-pay | Admitting: Podiatry

## 2020-05-12 ENCOUNTER — Other Ambulatory Visit: Payer: Self-pay

## 2020-05-12 DIAGNOSIS — M722 Plantar fascial fibromatosis: Secondary | ICD-10-CM | POA: Diagnosis not present

## 2020-05-12 NOTE — Progress Notes (Signed)
  Subjective:  Patient ID: Latoya Hicks, female    DOB: [birth year],  MRN: [id number]  Chief Complaint  Patient presents with  . Plantar Fasciitis    Patient presents for plantar fasciitis flare up right heel/arch x 12 days.  She also says her right hallux cramps when walking at times    69 y.o. female presents with the above complaint.  Patient presents with recurrence of the right heel pain that has been on for past 12 days.  Patient states that it is painful to walk on.  Patient states injection gave her few months of relief but it started coming back.  Patient states she is tried soaking up some of the Biofreeze but none of that helped.  She states the pain now goes up the leg.  She denies seeing anyone else prior to seeing me.  She denies any other acute complaints.  This is just the right side only this time.   Review of Systems: Negative except as noted in the HPI. Denies N/V/F/Ch.  Past Medical History:  Diagnosis Date  . Arthritis   . Heart murmur   . Hypertension     Current Outpatient Medications:  .  alendronate (FOSAMAX) 70 MG tablet, Take 70 mg by mouth once a week., Disp: , Rfl:  .  lisinopril-hydrochlorothiazide (PRINZIDE,ZESTORETIC) 10-12.5 MG tablet, Take 1 tablet by mouth daily., Disp: , Rfl: 1 .  Multiple Vitamin (MULTI-VITAMINS) TABS, Take by mouth., Disp: , Rfl:  .  pantoprazole (PROTONIX) 40 MG tablet, Take 1 tablet (40 mg total) by mouth daily., Disp: 30 tablet, Rfl: 1  Social History   Tobacco Use  Smoking Status Never Smoker  Smokeless Tobacco Never Used    No Known Allergies Objective:  There were no vitals filed for this visit. There is no height or weight on file to calculate BMI. Constitutional Well developed. Well nourished.  Vascular Dorsalis pedis pulses palpable bilaterally. Posterior tibial pulses palpable bilaterally. Capillary refill normal to all digits.  No cyanosis or clubbing noted. Pedal hair growth normal.  Neurologic Normal  speech. Oriented to person, place, and time. Epicritic sensation to light touch grossly present bilaterally.  Dermatologic Nails well groomed and normal in appearance. No open wounds. No skin lesions.  Orthopedic: Normal joint ROM without pain or crepitus bilaterally. No visible deformities. Tender to palpation at the calcaneal tuber right. No pain with calcaneal squeeze right. Ankle ROM diminished range of motion bilaterally. Silfverskiold Test: positive right.   Radiographs: Taken and reviewed. No acute fractures or dislocations. No evidence of stress fracture.  Plantar heel spur present. Posterior heel spur present.   Assessment:   1. Plantar fasciitis of right foot    Plan:  Patient was evaluated and treated and all questions answered.  Plantar Fasciitis, right - XR reviewed as above.  - Educated on icing and stretching. Instructions given.  -Hold off on giving in injection for now. - DME: Cam boot immobilization given that there is generalized pain to the hindfoot - Pharmacologic management: None -Cam boot was dispensed.  I will plan on doing a steroid injection if the cam boot does not help with the pain.  Patient agrees with the plan    No follow-ups on file.

## 2020-06-09 ENCOUNTER — Other Ambulatory Visit: Payer: Self-pay

## 2020-06-09 ENCOUNTER — Ambulatory Visit (INDEPENDENT_AMBULATORY_CARE_PROVIDER_SITE_OTHER): Payer: Medicare Other | Admitting: Podiatry

## 2020-06-09 ENCOUNTER — Encounter: Payer: Self-pay | Admitting: Podiatry

## 2020-06-09 DIAGNOSIS — M722 Plantar fascial fibromatosis: Secondary | ICD-10-CM

## 2020-06-09 NOTE — Progress Notes (Signed)
  Subjective:  Patient ID: Latoya Hicks, female    DOB: Nov 05, 1950,  MRN: [id number]  Chief Complaint  Patient presents with  . Plantar Fasciitis    Right foot is better, but left foot is giving me some pain again"    69 y.o. female presents with the above complaint.  Patient presents with a follow-up of recurrence of plantar fasciitis to the right heel.  Patient states she is doing a lot better after cam boot immobilization to the right.  She states the left side started hurting her likely due to compensation.  She denies any other acute complaints.  She is still does not want any steroid shots.   Review of Systems: Negative except as noted in the HPI. Denies N/V/F/Ch.  Past Medical History:  Diagnosis Date  . Arthritis   . Heart murmur   . Hypertension     Current Outpatient Medications:  .  alendronate (FOSAMAX) 70 MG tablet, Take 70 mg by mouth once a week., Disp: , Rfl:  .  lisinopril-hydrochlorothiazide (PRINZIDE,ZESTORETIC) 10-12.5 MG tablet, Take 1 tablet by mouth daily., Disp: , Rfl: 1 .  Multiple Vitamin (MULTI-VITAMINS) TABS, Take by mouth., Disp: , Rfl:  .  pantoprazole (PROTONIX) 40 MG tablet, Take 1 tablet (40 mg total) by mouth daily., Disp: 30 tablet, Rfl: 1  Social History   Tobacco Use  Smoking Status Never Smoker  Smokeless Tobacco Never Used    No Known Allergies Objective:  There were no vitals filed for this visit. There is no height or weight on file to calculate BMI. Constitutional Well developed. Well nourished.  Vascular Dorsalis pedis pulses palpable bilaterally. Posterior tibial pulses palpable bilaterally. Capillary refill normal to all digits.  No cyanosis or clubbing noted. Pedal hair growth normal.  Neurologic Normal speech. Oriented to person, place, and time. Epicritic sensation to light touch grossly present bilaterally.  Dermatologic Nails well groomed and normal in appearance. No open wounds. No skin lesions.  Orthopedic: Normal  joint ROM without pain or crepitus bilaterally. No visible deformities. Right plantar fasciitis improving Tender to palpation at the calcaneal tuber left No pain with calcaneal squeeze r left Ankle ROM diminished range of motion bilaterally. Silfverskiold Test: positive bilateral   Radiographs: Taken and reviewed. No acute fractures or dislocations. No evidence of stress fracture.  Plantar heel spur present. Posterior heel spur present.   Assessment:   1. Plantar fasciitis of right foot   2. Plantar fasciitis of left foot    Plan:  Patient was evaluated and treated and all questions answered.  Plantar Fasciitis, right -Clinically improving with cam boot immobilization.  Left plantar fasciitis -I discussed with her the importance of insoles and orthotics and various treatment options were discussed.  I believe patient will benefit from custom-made orthotics however due to financial restrictions she would like to think about it.  For now I recommended that she can obtain over-the-counter power steps. -She will obtain power steps orthotics and begin to start utilizing it.  If there is no improvement we will discuss custom orthotics at that time.   No follow-ups on file.

## 2020-07-21 ENCOUNTER — Ambulatory Visit: Payer: Medicare Other | Admitting: Podiatry

## 2020-08-04 ENCOUNTER — Ambulatory Visit (INDEPENDENT_AMBULATORY_CARE_PROVIDER_SITE_OTHER): Payer: Medicare Other | Admitting: Urology

## 2020-08-04 ENCOUNTER — Other Ambulatory Visit: Payer: Self-pay

## 2020-08-04 ENCOUNTER — Encounter: Payer: Self-pay | Admitting: Urology

## 2020-08-04 VITALS — BP 147/80 | HR 63 | Ht 65.0 in

## 2020-08-04 DIAGNOSIS — R3129 Other microscopic hematuria: Secondary | ICD-10-CM

## 2020-08-04 LAB — URINALYSIS, COMPLETE
Bilirubin, UA: NEGATIVE
Glucose, UA: NEGATIVE
Ketones, UA: NEGATIVE
Leukocytes,UA: NEGATIVE
Nitrite, UA: NEGATIVE
Protein,UA: NEGATIVE
Specific Gravity, UA: 1.02 (ref 1.005–1.030)
Urobilinogen, Ur: 0.2 mg/dL (ref 0.2–1.0)
pH, UA: 7 (ref 5.0–7.5)

## 2020-08-04 LAB — MICROSCOPIC EXAMINATION: Bacteria, UA: NONE SEEN

## 2020-08-04 NOTE — Progress Notes (Signed)
08/04/2020 1:17 PM   Latoya Hicks Aug 25, 1950 [id number]  Referring provider: Barbette Reichmann, MD [address] [facility] [place],  [place] [address]  Chief Complaint  Patient presents with  . Hematuria    HPI: 70 year old female referred back for further evaluation microscopic hematuria.  She was last seen and evaluated for the same issue in 2017.  She underwent CT urogram as well as cystoscopy both which were unremarkable.  Incidentally, her right distal ureter was incompletely opacified.  Review of multiple urinalysis over the years indicated she has had 4-10 red blood cells per high-powered field consistently since at least 2017.  Never smoker.  Notably, she did have an asymptomatic urethral caruncle on exam in 2017.  She has been having some left lower quadrant pain radiating to her hip which she relates to arthritis.  No overt flank pain.   PMH: Past Medical History:  Diagnosis Date  . Arthritis   . Heart murmur   . Hypertension     Surgical History: Past Surgical History:  Procedure Laterality Date  . ANKLE SURGERY Left 1998    Home Medications:  Allergies as of 08/04/2020   No Known Allergies     Medication List       Accurate as of August 04, 2020  1:17 PM. If you have any questions, ask your nurse or doctor.        alendronate 70 MG tablet Commonly known as: FOSAMAX Take 70 mg by mouth once a week.   amLODipine 5 MG tablet Commonly known as: NORVASC Take 5 mg by mouth daily.   lisinopril-hydrochlorothiazide 10-12.5 MG tablet Commonly known as: ZESTORETIC Take 1 tablet by mouth daily.   Multi-Vitamins Tabs Take by mouth.   pantoprazole 40 MG tablet Commonly known as: Protonix Take 1 tablet (40 mg total) by mouth daily.       Allergies: No Known Allergies  Family History: Family History  Problem Relation Age of Onset  . Prostate cancer Father   . Prostate cancer Brother   . Kidney cancer Neg Hx   . Breast  cancer Neg Hx     Social History:  reports that she has never smoked. She has never used smokeless tobacco. She reports that she does not drink alcohol and does not use drugs.   Physical Exam: BP (!) 147/80   Pulse 63   Ht 5\' 5"  (1.651 m)   BMI 30.79 kg/m   Constitutional:  Alert and oriented, No acute distress. HEENT: [place] AT, moist mucus membranes.  Trachea midline, no masses. Cardiovascular: No clubbing, cyanosis, or edema. Respiratory: Normal respiratory effort, no increased work of breathing. Skin: No rashes, bruises or suspicious lesions. Neurologic: Grossly intact, no focal deficits, moving all 4 extremities. Psychiatric: Normal mood and affect.  Laboratory Data: Lab Results  Component Value Date   WBC 5.0 07/06/2019   HGB 12.9 07/06/2019   HCT 37.9 07/06/2019   MCV 90.7 07/06/2019   PLT 263 07/06/2019    Lab Results  Component Value Date   CREATININE 0.85 07/06/2019   Urinalysis UA today with 3-10 red blood cells per high-powered field  Pertinent Imaging: No new upper tract imaging  Assessment & Plan:    1. Microscopic hematuria Persistent microscopic hematuria, relatively low number of RBCs in low risk patient except for age alone  Given that she is not had a work-up since 2017, recommended a modified repeat hematuria evaluation including renal ultrasound and cystoscopy.  Risk and benefits discussed.  She  is agreeable this plan.  - Urinalysis, Complete - Ultrasound renal complete; Future    Vanna Scotland, MD  [facility] [address], [address] [place], [place] [address] [phone]

## 2020-08-04 NOTE — Patient Instructions (Signed)
Cystoscopy Cystoscopy is a procedure that is used to help diagnose and sometimes treat conditions that affect the lower urinary tract. The lower urinary tract includes the bladder and the urethra. The urethra is the tube that drains urine from the bladder. Cystoscopy is done using a thin, tube-shaped instrument with a light and camera at the end (cystoscope). The cystoscope may be hard or flexible, depending on the goal of the procedure. The cystoscope is inserted through the urethra, into the bladder. Cystoscopy may be recommended if you have:  Urinary tract infections that keep coming back.  Blood in the urine (hematuria).  An inability to control when you urinate (urinary incontinence) or an overactive bladder.  Unusual cells found in a urine sample.  A blockage in the urethra, such as a urinary stone.  Painful urination.  An abnormality in the bladder found during an intravenous pyelogram (IVP) or CT scan. Cystoscopy may also be done to remove a sample of tissue to be examined under a microscope (biopsy). What are the risks? Generally, this is a safe procedure. However, problems may occur, including:  Infection.  Bleeding.  What happens during the procedure?  1. You will be given one or more of the following: ? A medicine to numb the area (local anesthetic). 2. The area around the opening of your urethra will be cleaned. 3. The cystoscope will be passed through your urethra into your bladder. 4. Germ-free (sterile) fluid will flow through the cystoscope to fill your bladder. The fluid will stretch your bladder so that your health care provider can clearly examine your bladder walls. 5. Your doctor will look at the urethra and bladder. 6. The cystoscope will be removed The procedure may vary among health care providers  What can I expect after the procedure? After the procedure, it is common to have: 1. Some soreness or pain in your abdomen and urethra. 2. Urinary symptoms.  These include: ? Mild pain or burning when you urinate. Pain should stop within a few minutes after you urinate. This may last for up to 1 week. ? A small amount of blood in your urine for several days. ? Feeling like you need to urinate but producing only a small amount of urine. Follow these instructions at home: General instructions  Return to your normal activities as told by your health care provider.   Do not drive for 24 hours if you were given a sedative during your procedure.  Watch for any blood in your urine. If the amount of blood in your urine increases, call your health care provider.  If a tissue sample was removed for testing (biopsy) during your procedure, it is up to you to get your test results. Ask your health care provider, or the department that is doing the test, when your results will be ready.  Drink enough fluid to keep your urine pale yellow.  Keep all follow-up visits as told by your health care provider. This is important. Contact a health care provider if you:  Have pain that gets worse or does not get better with medicine, especially pain when you urinate.  Have trouble urinating.  Have more blood in your urine. Get help right away if you:  Have blood clots in your urine.  Have abdominal pain.  Have a fever or chills.  Are unable to urinate. Summary  Cystoscopy is a procedure that is used to help diagnose and sometimes treat conditions that affect the lower urinary tract.  Cystoscopy is done using   a thin, tube-shaped instrument with a light and camera at the end.  After the procedure, it is common to have some soreness or pain in your abdomen and urethra.  Watch for any blood in your urine. If the amount of blood in your urine increases, call your health care provider.  If you were prescribed an antibiotic medicine, take it as told by your health care provider. Do not stop taking the antibiotic even if you start to feel better. This  information is not intended to replace advice given to you by your health care provider. Make sure you discuss any questions you have with your health care provider. Document Revised: 06/12/2018 Document Reviewed: 06/12/2018 Elsevier Patient Education  2020 [employer]   

## 2020-08-13 ENCOUNTER — Other Ambulatory Visit: Payer: Self-pay

## 2020-08-13 ENCOUNTER — Ambulatory Visit
Admission: RE | Admit: 2020-08-13 | Discharge: 2020-08-13 | Disposition: A | Discharge status: Home / Self Care | Payer: Medicare Other | Source: Ambulatory Visit | Attending: Urology | Admitting: Urology

## 2020-08-13 DIAGNOSIS — R3129 Other microscopic hematuria: Secondary | ICD-10-CM | POA: Insufficient documentation

## 2020-08-14 ENCOUNTER — Telehealth: Payer: Self-pay | Admitting: *Deleted

## 2020-08-14 NOTE — Telephone Encounter (Addendum)
Left patient a VM with details, asked to return call with any questions.   ----- Message from Vanna Scotland, MD sent at 08/14/2020  9:53 AM EST ----- Renal ultrasound looks fine.  We will see her at her cystoscopy appointment coming up.  Vanna Scotland, MD

## 2020-08-26 ENCOUNTER — Other Ambulatory Visit: Payer: Self-pay | Admitting: Urology

## 2020-09-02 ENCOUNTER — Ambulatory Visit (INDEPENDENT_AMBULATORY_CARE_PROVIDER_SITE_OTHER): Payer: Medicare Other | Admitting: Urology

## 2020-09-02 ENCOUNTER — Other Ambulatory Visit: Payer: Self-pay

## 2020-09-02 VITALS — BP 130/77 | HR 69

## 2020-09-02 DIAGNOSIS — R3129 Other microscopic hematuria: Secondary | ICD-10-CM | POA: Diagnosis not present

## 2020-09-02 NOTE — Progress Notes (Signed)
   09/02/20  CC:  Chief Complaint  Patient presents with  . Cysto    HPI: 70 year old female with recurrent microscopic hematuria who presents today for cystoscopic evaluation.  In the interim, she is undergone a renal ultrasound which was unremarkable.  She underwent work-up including CT urogram as well as cystoscopy in 2017 which was also unremarkable.  Blood pressure 130/77, pulse 69. NED. A&Ox3.   No respiratory distress   Abd soft, NT, ND Normal external genitalia with patent urethral meatus  Cystoscopy Procedure Note  Patient identification was confirmed, informed consent was obtained, and patient was prepped using Betadine solution.  Lidocaine jelly was administered per urethral meatus.    Procedure: - Flexible cystoscope introduced, without any difficulty.   - Thorough search of the bladder revealed:    normal urethral meatus    normal urothelium    no stones    no ulcers     no tumors    no urethral polyps    no trabeculation  - Ureteral orifices were normal in position and appearance.  Post-Procedure: - Patient tolerated the procedure well  Assessment/ Plan:  1. Microscopic hematuria Microscopic hematuria evaluation negative today including normal cystoscopy  Renal ultrasound is unremarkable as well  I have advised her to follow-up in 2 years if her microscopic hematuria persists or sooner if it worsens or becomes gross hematuria.  She is agreeable this plan. - Urinalysis, Complete \ Vanna Scotland, MD

## 2020-09-03 LAB — URINALYSIS, COMPLETE
Bilirubin, UA: NEGATIVE
Glucose, UA: NEGATIVE
Ketones, UA: NEGATIVE
Leukocytes,UA: NEGATIVE
Nitrite, UA: NEGATIVE
Protein,UA: NEGATIVE
Specific Gravity, UA: 1.01 (ref 1.005–1.030)
Urobilinogen, Ur: 0.2 mg/dL (ref 0.2–1.0)
pH, UA: 5 (ref 5.0–7.5)

## 2020-09-03 LAB — MICROSCOPIC EXAMINATION
Bacteria, UA: NONE SEEN
Renal Epithel, UA: NONE SEEN /hpf
WBC, UA: NONE SEEN /hpf (ref 0–5)

## 2020-11-06 ENCOUNTER — Other Ambulatory Visit: Payer: Self-pay | Admitting: Internal Medicine

## 2020-11-06 DIAGNOSIS — Z1231 Encounter for screening mammogram for malignant neoplasm of breast: Secondary | ICD-10-CM

## 2020-11-11 ENCOUNTER — Other Ambulatory Visit: Payer: Self-pay

## 2020-11-11 ENCOUNTER — Ambulatory Visit
Admission: RE | Admit: 2020-11-11 | Discharge: 2020-11-11 | Disposition: A | Discharge status: Home / Self Care | Payer: Medicare Other | Source: Ambulatory Visit | Attending: Internal Medicine | Admitting: Internal Medicine

## 2020-11-11 DIAGNOSIS — Z1231 Encounter for screening mammogram for malignant neoplasm of breast: Secondary | ICD-10-CM | POA: Diagnosis present

## 2020-11-25 ENCOUNTER — Other Ambulatory Visit (HOSPITAL_COMMUNITY): Payer: Self-pay | Admitting: Physician Assistant

## 2020-11-25 ENCOUNTER — Other Ambulatory Visit: Payer: Self-pay | Admitting: Physician Assistant

## 2020-11-25 DIAGNOSIS — S32020A Wedge compression fracture of second lumbar vertebra, initial encounter for closed fracture: Secondary | ICD-10-CM

## 2020-11-26 ENCOUNTER — Ambulatory Visit
Admission: RE | Admit: 2020-11-26 | Discharge: 2020-11-26 | Disposition: A | Discharge status: Home / Self Care | Payer: Medicare Other | Source: Ambulatory Visit | Attending: Physician Assistant | Admitting: Physician Assistant

## 2020-11-26 ENCOUNTER — Other Ambulatory Visit: Payer: Self-pay

## 2020-11-26 DIAGNOSIS — S32020A Wedge compression fracture of second lumbar vertebra, initial encounter for closed fracture: Secondary | ICD-10-CM | POA: Diagnosis present

## 2021-12-23 ENCOUNTER — Other Ambulatory Visit: Payer: Self-pay | Admitting: Internal Medicine

## 2021-12-23 DIAGNOSIS — Z1231 Encounter for screening mammogram for malignant neoplasm of breast: Secondary | ICD-10-CM

## 2022-01-20 ENCOUNTER — Ambulatory Visit
Admission: RE | Admit: 2022-01-20 | Discharge: 2022-01-20 | Disposition: A | Discharge status: Home / Self Care | Payer: Medicare Other | Source: Ambulatory Visit | Attending: Internal Medicine | Admitting: Internal Medicine

## 2022-01-20 DIAGNOSIS — Z1231 Encounter for screening mammogram for malignant neoplasm of breast: Secondary | ICD-10-CM | POA: Diagnosis not present

## 2022-11-10 IMAGING — MR MR LUMBAR SPINE W/O CM
4 of 5 series · 30 of 48 positions shown · non-contrast
Comparison: None.

CLINICAL DATA: L2 compression fracture

EXAM:
MRI LUMBAR SPINE WITHOUT CONTRAST
TECHNIQUE: Multiplanar, multisequence MR imaging of the lumbar spine was
performed. No intravenous contrast was administered.

[Series 2: T2 · sagittal · 4.0mm · 1.02mm/px · 6 of 15 slices shown (1 of 2)]
[im 1/15]
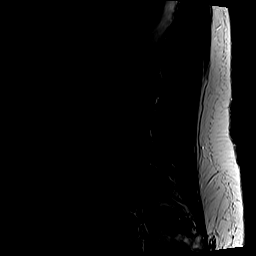
[im 3/15]
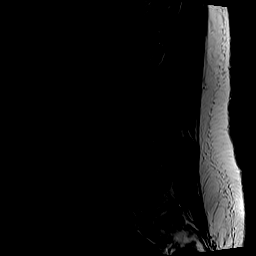
[im 6/15]
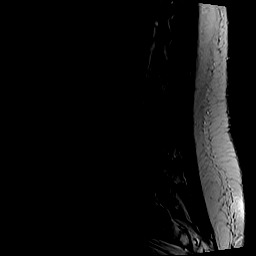
[im 9/15]
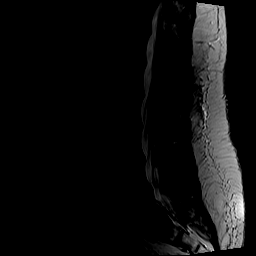
[im 12/15]
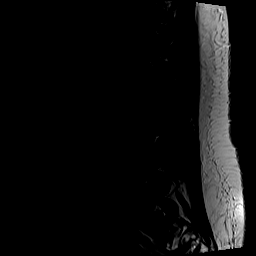
[im 15/15]
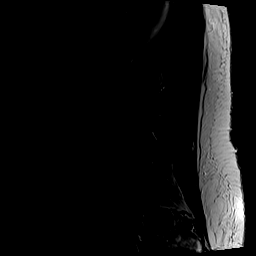

[Series 3: T1 · sagittal · 4.0mm · 0.51mm/px · 5 of 15 slices shown (1 of 2)]
[im 1/15]
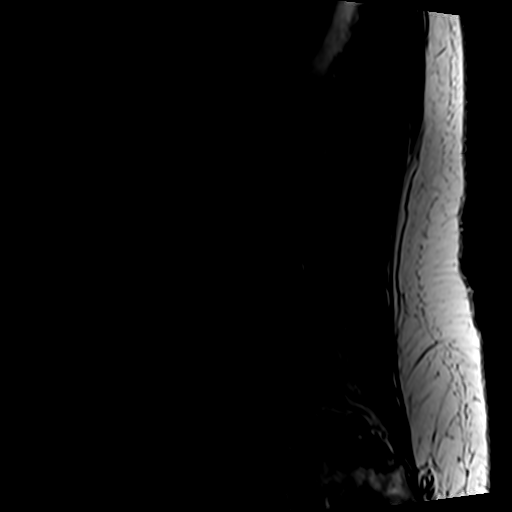
[im 4/15]
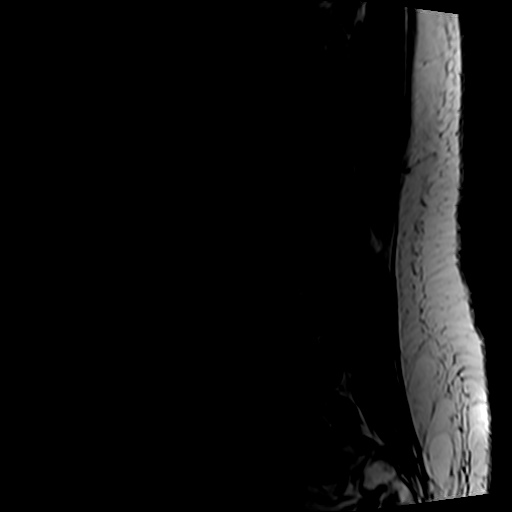
[im 8/15]
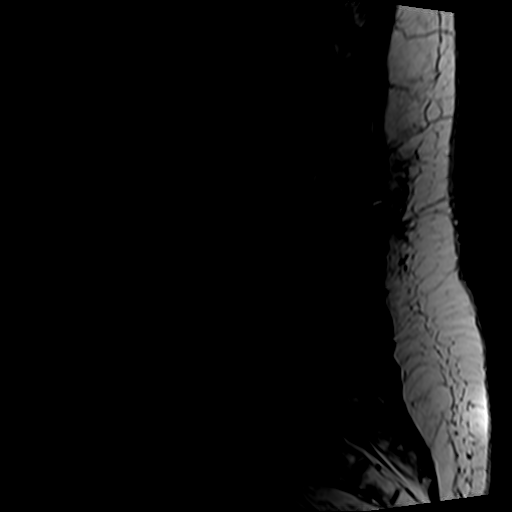
[im 11/15]
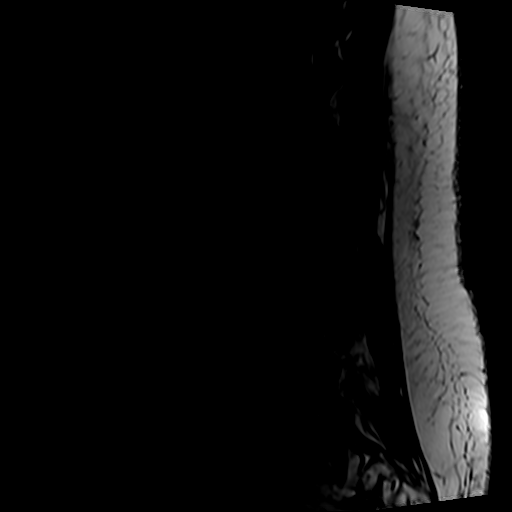
[im 15/15]
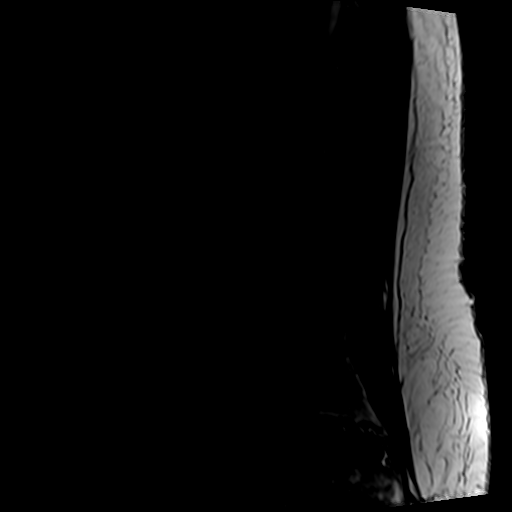

[Series 5: T2 · axial · 4.0mm · 0.78mm/px · z∈[-64,+181]mm · 10 of 45 slices shown (2 of 2)]
[im 3/45]
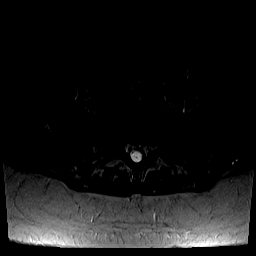
[im 6/45]
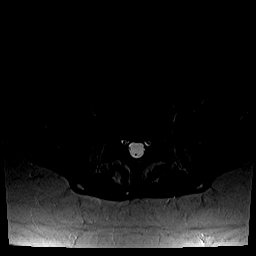
[im 9/45]
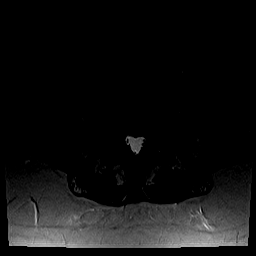
[im 15/45]
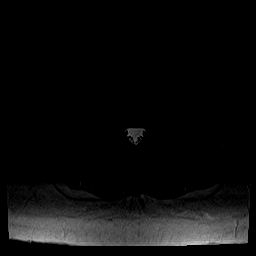
[im 21/45]
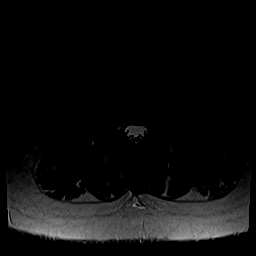
[im 24/45]
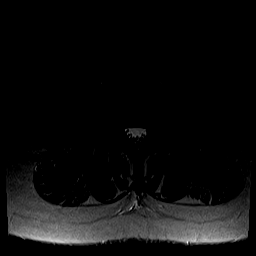
[im 27/45]
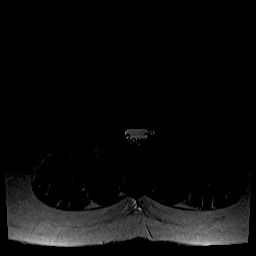
[im 33/45]
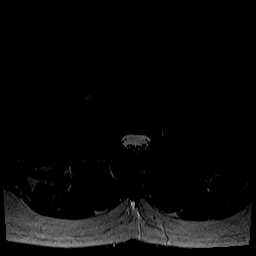
[im 39/45]
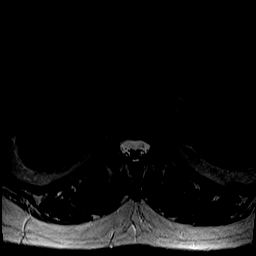
[im 45/45]
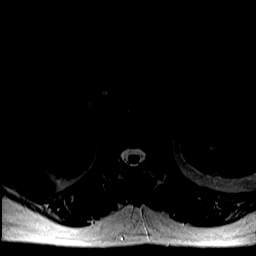

[Series 6: T1 · axial · 4.0mm · 0.39mm/px · z∈[-64,+151]mm · 9 of 45 slices shown (2 of 2)]
[im 3/45]
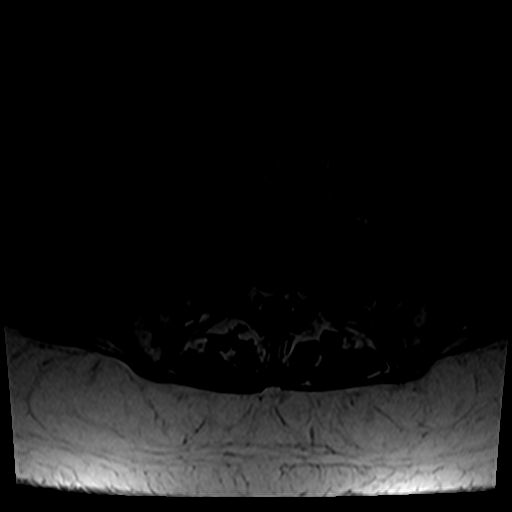
[im 6/45]
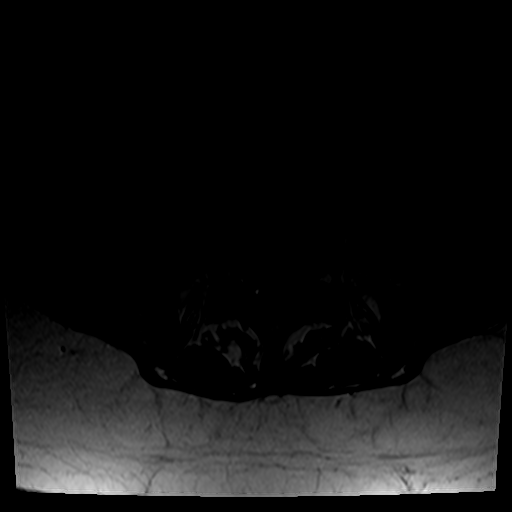
[im 9/45]
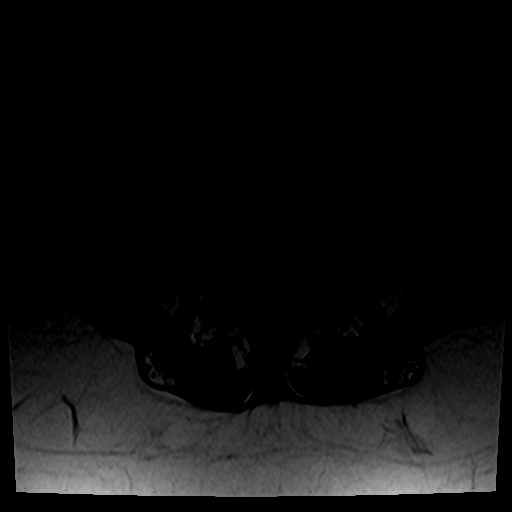
[im 15/45]
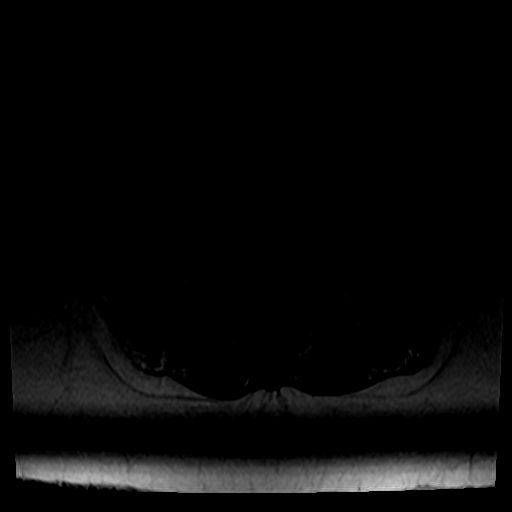
[im 21/45]
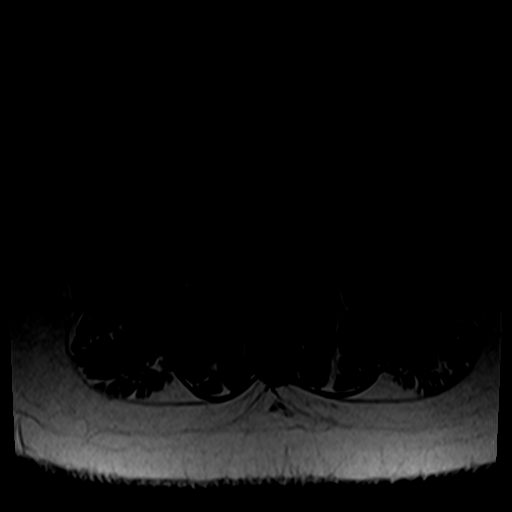
[im 24/45]
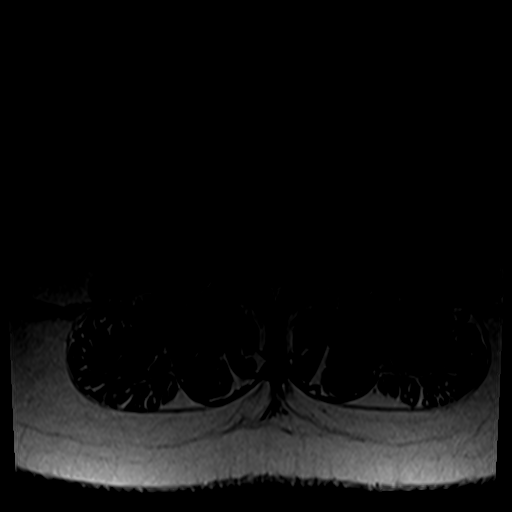
[im 27/45]
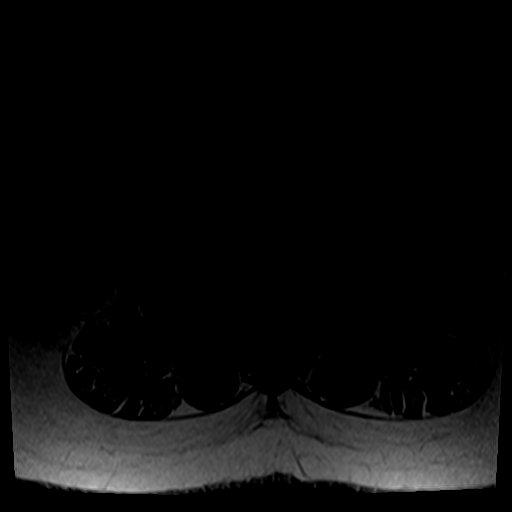
[im 33/45]
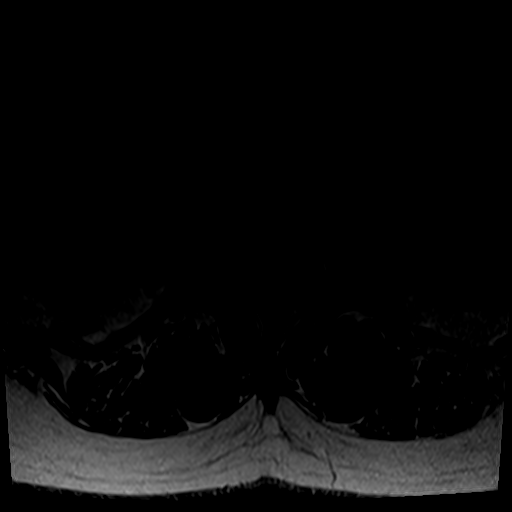
[im 39/45]
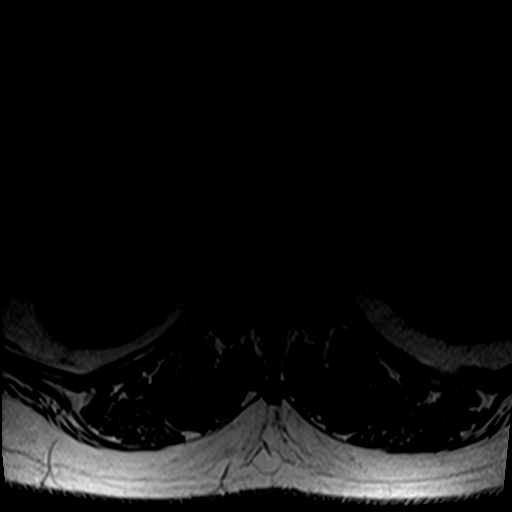

[30 of 48 positions shown; findings below may reference images not displayed]

FINDINGS: Motion artifact is present.

Segmentation: Transitional anatomy at the lumbosacral junction. For
the purposes of this dictation, there is partial sacralization of
L5.

Alignment:  No significant anteroposterior listhesis.

Vertebrae: Vertebral body heights are maintained. Marrow signal is
mildly heterogeneous, which may reflect demineralization. No marrow
edema. No suspicious osseous lesion.

Conus medullaris and cauda equina: Conus extends to the upper L1
level. Conus and cauda equina appear normal.

Paraspinal and other soft tissues: Unremarkable.

Disc levels:

L1-L2: Disc bulge slightly eccentric to the right. No canal
stenosis. Mild foraminal stenosis.

L2-L3: Disc bulge. Minor canal stenosis. Slight effacement of the
subarticular recesses. Minor foraminal stenosis.

L3-L4: Disc bulge slightly eccentric to the left. Minor facet
arthropathy with ligamentum flavum infolding. Minor canal stenosis.
Slight effacement of the subarticular recesses. Minor right and mild
left foraminal stenosis.

L4-L5: Disc bulge. Minor facet arthropathy. No canal stenosis.
Slight effacement of the subarticular recesses. No right foraminal
stenosis. Mild left foraminal stenosis.

L5-S1: Prominent right lateral bridging osteophyte. No canal or
foraminal stenosis.
IMPRESSION: No compression fracture.

Mild multilevel degenerative changes as detailed above.

## 2022-12-05 ENCOUNTER — Other Ambulatory Visit: Payer: Self-pay | Admitting: Internal Medicine

## 2022-12-05 DIAGNOSIS — Z1231 Encounter for screening mammogram for malignant neoplasm of breast: Secondary | ICD-10-CM

## 2022-12-16 ENCOUNTER — Ambulatory Visit: Payer: Medicare Other

## 2022-12-16 DIAGNOSIS — Z1211 Encounter for screening for malignant neoplasm of colon: Secondary | ICD-10-CM

## 2022-12-16 DIAGNOSIS — K640 First degree hemorrhoids: Secondary | ICD-10-CM

## 2022-12-16 DIAGNOSIS — K5730 Diverticulosis of large intestine without perforation or abscess without bleeding: Secondary | ICD-10-CM

## 2022-12-16 DIAGNOSIS — Z800 Family history of malignant neoplasm of digestive organs: Secondary | ICD-10-CM

## 2023-04-21 ENCOUNTER — Ambulatory Visit
Admission: RE | Admit: 2023-04-21 | Discharge: 2023-04-21 | Disposition: A | Discharge status: Home / Self Care | Payer: Medicare Other | Source: Ambulatory Visit | Attending: Internal Medicine | Admitting: Internal Medicine

## 2023-04-21 DIAGNOSIS — Z1231 Encounter for screening mammogram for malignant neoplasm of breast: Secondary | ICD-10-CM | POA: Diagnosis present

## 2023-10-14 LAB — HEMOGLOBIN A1C: Hemoglobin A1C: 5.4

## 2023-10-14 LAB — AMB RESULTS CONSOLE CBG: Glucose: 109

## 2023-10-14 NOTE — Progress Notes (Unsigned)
 This pt attended 10/14/23 screening event and BP was 130/80, blood glucose was 109 and A1C was 5.4. Pt did not indicate any SDOH needs.  Further f/u to be scheduled per health equity protocol.

## 2023-12-26 NOTE — Progress Notes (Signed)
 The patient attended a screening event on 10/14/2023 where her blood pressure was measured at 120/80 mmHg, and her A1C was 5.4%, and her non-fasting blood glucose was 109 mg/dl. During the event, the patient reported that she does not smoke, has [employer], is established with a primary care provider (Dr. ALONSO Leventhal), and indicated X's for food, transportation, housing, and utilities but there was nothing indicated for safety.   A chart review confirmed that the patient does have an active PCP, Dr. Tamra Leventhal with [facility]. Chart review further indicates that the patient has Medicare for their insurance. The pt's safety SDOH is currently not a need and does not have any needs indicated at this time.   The patient's most recent office visit was on 12/22/2023 with her PCP. She has two upcoming appts with them on 04/19/24 at 7:30 AM and on 04/25/24 at 9:45 AM. The patient demonstrates consistent engagement in her care.   At this time, no additional support from the Health Equity Team is indicated.

## 2024-04-24 ENCOUNTER — Other Ambulatory Visit: Payer: Self-pay | Admitting: Internal Medicine

## 2024-04-24 DIAGNOSIS — Z1231 Encounter for screening mammogram for malignant neoplasm of breast: Secondary | ICD-10-CM

## 2024-05-21 ENCOUNTER — Ambulatory Visit
Admission: RE | Admit: 2024-05-21 | Discharge: 2024-05-21 | Disposition: A | Discharge status: Home / Self Care | Source: Ambulatory Visit | Attending: Internal Medicine | Admitting: Internal Medicine

## 2024-05-21 DIAGNOSIS — Z1231 Encounter for screening mammogram for malignant neoplasm of breast: Secondary | ICD-10-CM | POA: Insufficient documentation

## 2024-06-11 ENCOUNTER — Ambulatory Visit: Admitting: Podiatry

## 2024-06-11 DIAGNOSIS — M2042 Other hammer toe(s) (acquired), left foot: Secondary | ICD-10-CM | POA: Diagnosis not present

## 2024-06-11 NOTE — Progress Notes (Unsigned)
°  Subjective:  Patient ID: Latoya Hicks, female    DOB: July 17, 1950,  MRN: [id number]  Chief Complaint  Patient presents with   Toe Pain    Left foot 5th toe pain     73 y.o. female presents with the above complaint.  Patient presents with concern for left fifth digit toe contracture.  She states that is causing her some discomfort wanted get it evaluated she has not seen me in few years.  Pain scale 7 out of 10 dull aching nature hurts with ambulation or shoe pressure.  She wanted discuss treatment options for her shoes tried shoe gear modification padding offloading none of which has helped.   Review of Systems: Negative except as noted in the HPI. Denies N/V/F/Ch.  Past Medical History:  Diagnosis Date   Arthritis    Heart murmur    Hypertension    Current Medications[1]  Tobacco Use History[2]  Allergies[3] Objective:  There were no vitals filed for this visit. There is no height or weight on file to calculate BMI. Constitutional Well developed. Well nourished.  Vascular Dorsalis pedis pulses palpable bilaterally. Posterior tibial pulses palpable bilaterally. Capillary refill normal to all digits.  No cyanosis or clubbing noted. Pedal hair growth normal.  Neurologic Normal speech. Oriented to person, place, and time. Epicritic sensation to light touch grossly present bilaterally.  Dermatologic Nails well groomed and normal in appearance. No open wounds. No skin lesions.  Orthopedic: Left fifth digit, flexible hammertoe contracture noted pain on palpation to the fifth digit.  Distal tip hyperkeratotic lesion noted on the lateral surface of the fifth toe.  No open wounds or lesion noted   Radiographs: None Assessment:   1. Hammertoe of left foot    Plan:  Patient was evaluated and treated and all questions answered.  Left fifth digit hammertoe contracture - All questions and concerns were discussed with the patient in extensive detail given the amount of pain that  she is having she will eventually benefit from left fifth digit derotational arthroplasty however for now we will continue managing conservatively.  I we discussed shoe gear modification she will also benefit from toe covering as well.  She agrees with the plan she will think about the procedure and will get back to me  No follow-ups on file.     [1]  Current Outpatient Medications:    alendronate (FOSAMAX) 70 MG tablet, Take 70 mg by mouth once a week., Disp: , Rfl:    amLODipine (NORVASC) 5 MG tablet, Take 5 mg by mouth daily., Disp: , Rfl:    lisinopril-hydrochlorothiazide (PRINZIDE,ZESTORETIC) 10-12.5 MG tablet, Take 1 tablet by mouth daily., Disp: , Rfl: 1   Multiple Vitamin (MULTI-VITAMINS) TABS, Take by mouth., Disp: , Rfl:    pantoprazole  (PROTONIX ) 40 MG tablet, Take 1 tablet (40 mg total) by mouth daily., Disp: 30 tablet, Rfl: 1 [2]  Social History Tobacco Use  Smoking Status Never  Smokeless Tobacco Never  [3] No Known Allergies
